# Patient Record
Sex: Female | Born: 1965 | Race: Black or African American | Hispanic: No | Marital: Married
Health system: Southern US, Community
[De-identification: ages and names within clinical notes are randomized; demographics above are authoritative.]

---

## 2012-07-19 ENCOUNTER — Emergency Department: Payer: Self-pay | Admitting: *Deleted

## 2012-07-19 LAB — COMPREHENSIVE METABOLIC PANEL
Albumin: 4.4 g/dL (ref 3.4–5.0)
Alkaline Phosphatase: 80 U/L (ref 50–136)
Calcium, Total: 9.4 mg/dL (ref 8.5–10.1)
Chloride: 106 mmol/L (ref 98–107)
Creatinine: 0.77 mg/dL (ref 0.60–1.30)
EGFR (African American): >60
EGFR (Non-African Amer.): >60
Glucose: 84 mg/dL (ref 65–99)
Osmolality: 276 (ref 275–301)
Potassium: 4.1 mmol/L (ref 3.5–5.1)
Sodium: 139 mmol/L (ref 136–145)

## 2012-07-19 LAB — URINALYSIS, COMPLETE
Bilirubin,UR: NEGATIVE
Glucose,UR: NEGATIVE mg/dL (ref 0–75)
Ketone: NEGATIVE
Protein: NEGATIVE
RBC,UR: 5 /HPF (ref 0–5)
Specific Gravity: 1.027 (ref 1.003–1.030)
WBC UR: 2 /HPF (ref 0–5)

## 2012-07-19 LAB — CBC WITH DIFFERENTIAL/PLATELET
Basophil %: 0.7 %
Eosinophil %: 1.3 %
HCT: 44 % (ref 35.0–47.0)
HGB: 14.5 g/dL (ref 12.0–16.0)
MCH: 28.1 pg (ref 26.0–34.0)
Monocyte #: 0.3 x10 3/mm (ref 0.2–0.9)
Monocyte %: 6.2 %
Neutrophil %: 51.4 %
Platelet: 171 10*3/uL (ref 150–440)
RBC: 5.15 10*6/uL (ref 3.80–5.20)
WBC: 4.7 10*3/uL (ref 3.6–11.0)

## 2020-08-01 ENCOUNTER — Emergency Department
Admission: EM | Admit: 2020-08-01 | Discharge: 2020-08-01 | Disposition: A | Discharge status: Home / Self Care | Payer: BC Managed Care – PPO | Attending: Emergency Medicine | Admitting: Emergency Medicine

## 2020-08-01 ENCOUNTER — Emergency Department: Payer: BC Managed Care – PPO

## 2020-08-01 ENCOUNTER — Other Ambulatory Visit: Payer: Self-pay

## 2020-08-01 DIAGNOSIS — S39012A Strain of muscle, fascia and tendon of lower back, initial encounter: Secondary | ICD-10-CM | POA: Insufficient documentation

## 2020-08-01 DIAGNOSIS — S3992XA Unspecified injury of lower back, initial encounter: Secondary | ICD-10-CM | POA: Diagnosis present

## 2020-08-01 DIAGNOSIS — Y939 Activity, unspecified: Secondary | ICD-10-CM | POA: Diagnosis not present

## 2020-08-01 DIAGNOSIS — Y9289 Other specified places as the place of occurrence of the external cause: Secondary | ICD-10-CM | POA: Insufficient documentation

## 2020-08-01 DIAGNOSIS — Y998 Other external cause status: Secondary | ICD-10-CM | POA: Diagnosis not present

## 2020-08-01 MED ORDER — KETOROLAC TROMETHAMINE 30 MG/ML IJ SOLN
30.0000 mg | Freq: Once | INTRAMUSCULAR | Status: AC
  Administered 2020-08-01: 30 mg via INTRAMUSCULAR
  Filled 2020-08-01: qty 1

## 2020-08-01 MED ORDER — KETOROLAC TROMETHAMINE 10 MG PO TABS: 10.0000 mg | ORAL_TABLET | Freq: Three times a day (TID) | ORAL | 0 refills | Status: AC

## 2020-08-01 MED ORDER — CYCLOBENZAPRINE HCL 5 MG PO TABS: 5.0000 mg | ORAL_TABLET | Freq: Three times a day (TID) | ORAL | 0 refills | Status: AC | PRN

## 2020-08-01 MED ORDER — CYCLOBENZAPRINE HCL 10 MG PO TABS
10.0000 mg | ORAL_TABLET | Freq: Once | ORAL | Status: AC
  Administered 2020-08-01: 10 mg via ORAL
  Filled 2020-08-01: qty 1
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
## 2021-12-25 IMAGING — CR DG LUMBAR SPINE COMPLETE 4+V
1 series · 5 of 5 positions shown · non-contrast
Comparison: None.

CLINICAL DATA: Midline back pain

EXAM:
LUMBAR SPINE - COMPLETE 4+ VIEW

[Series 1: dg lumbar spine complete 4 +v · 0.14mm/px · 5 of 5 slices shown]
[im 1/5]
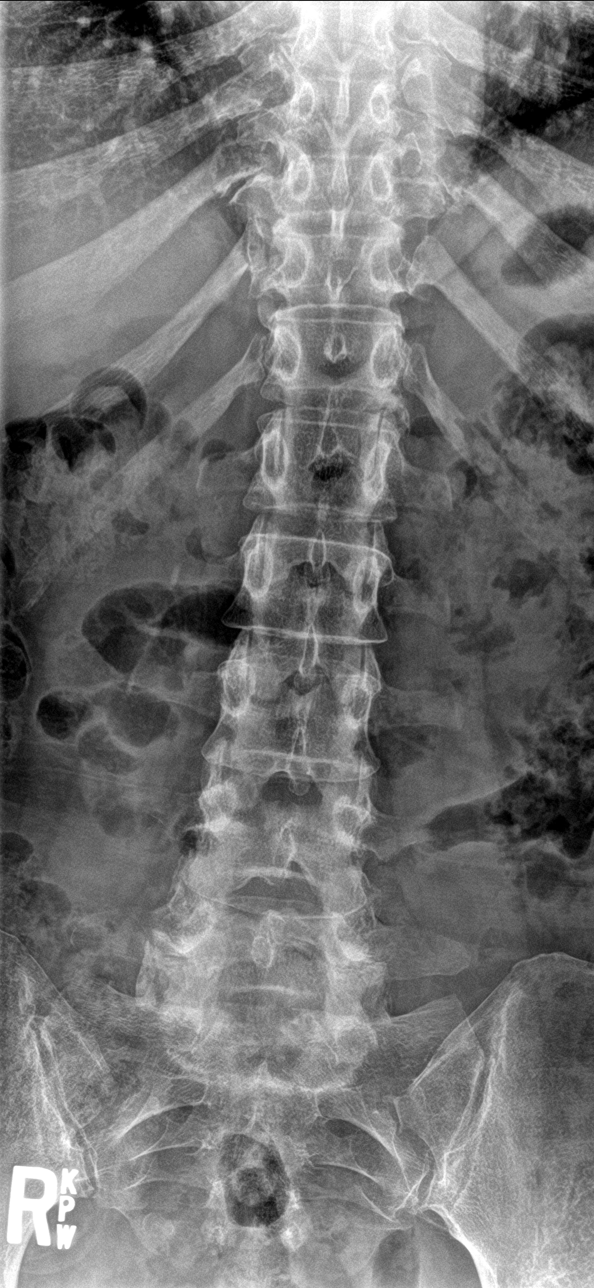
[im 2/5]
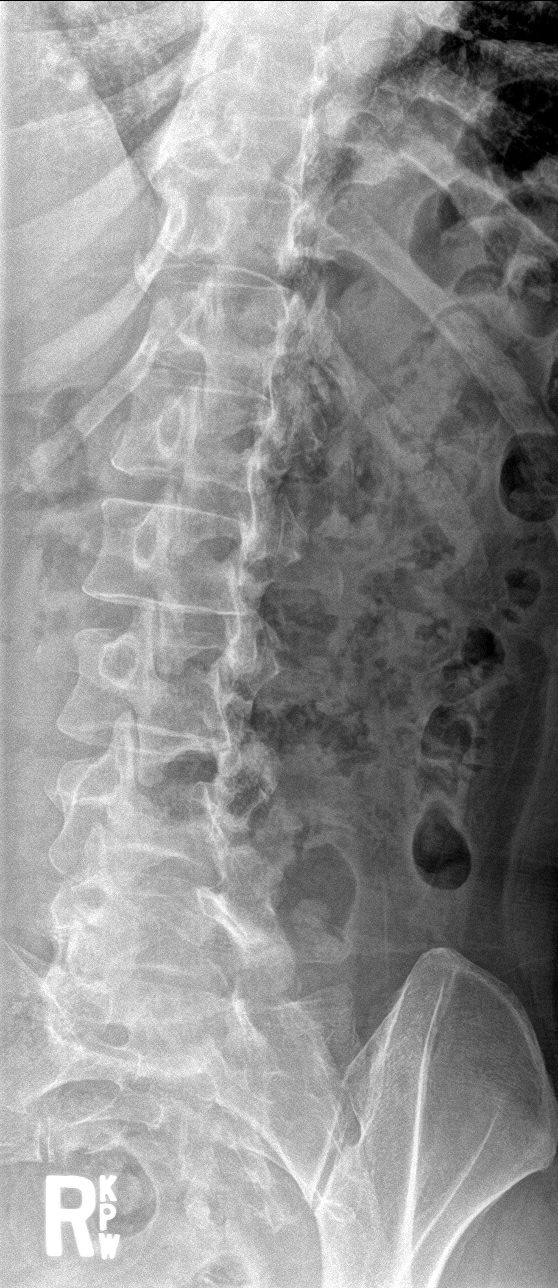
[im 3/5]
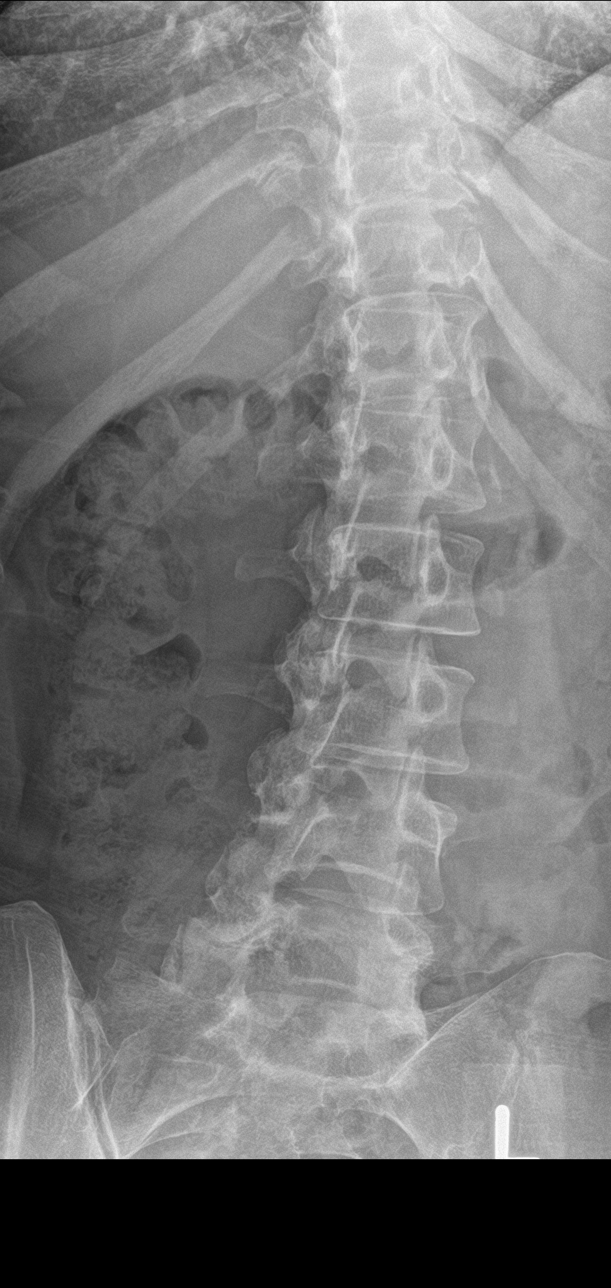
[im 4/5]
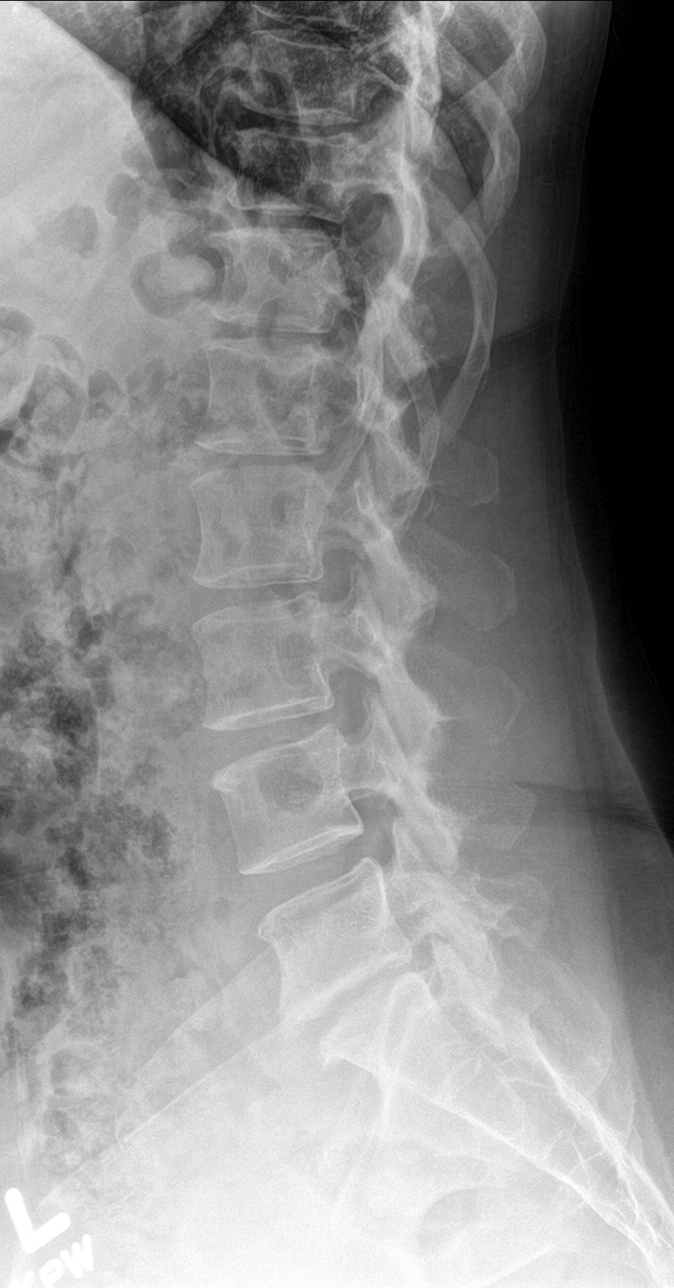
[im 5/5]
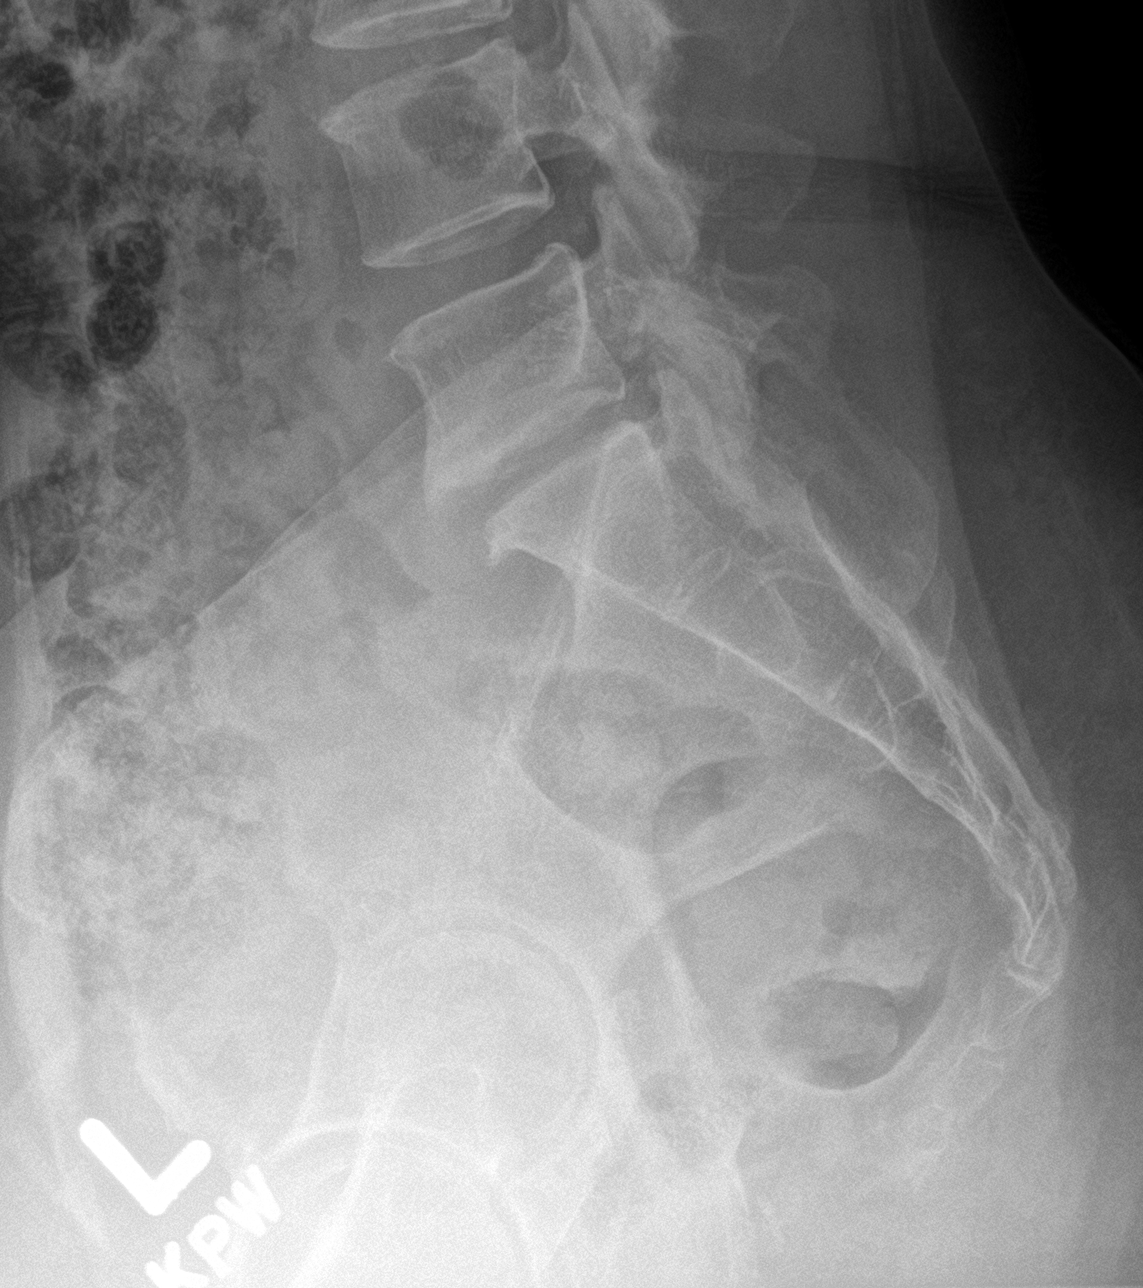

[5 of 5 positions shown; findings below may reference images not displayed]

FINDINGS: There is no acute compression fracture. No significant malalignment.
There is moderate disc height loss at the L5-S1 level. There is
facet arthrosis in the lower lumbar segments that is mild.
IMPRESSION: Negative.
